# Patient Record
Sex: Female | Born: 2000 | Race: White | Hispanic: No | Marital: Single | Smoking: Never smoker
Health system: Southern US, Community
[De-identification: ages and names within clinical notes are randomized; demographics above are authoritative.]

## PROBLEM LIST (undated history)

## (undated) HISTORY — PX: EYE SURGERY: SHX253

---
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
## 2020-07-22 ENCOUNTER — Encounter: Payer: Self-pay | Admitting: Emergency Medicine

## 2020-07-22 ENCOUNTER — Ambulatory Visit (INDEPENDENT_AMBULATORY_CARE_PROVIDER_SITE_OTHER): Payer: BC Managed Care – PPO

## 2020-07-22 ENCOUNTER — Other Ambulatory Visit: Payer: Self-pay

## 2020-07-22 ENCOUNTER — Ambulatory Visit
Admission: EM | Admit: 2020-07-22 | Discharge: 2020-07-22 | Disposition: A | Discharge status: Home / Self Care | Payer: BC Managed Care – PPO | Attending: Internal Medicine | Admitting: Internal Medicine

## 2020-07-22 DIAGNOSIS — J029 Acute pharyngitis, unspecified: Secondary | ICD-10-CM

## 2020-07-22 DIAGNOSIS — R042 Hemoptysis: Secondary | ICD-10-CM

## 2020-07-22 LAB — GROUP A STREP BY PCR: Group A Strep by PCR: NOT DETECTED

## 2020-07-22 MED ORDER — ACYCLOVIR 400 MG PO TABS: 400.0000 mg | ORAL_TABLET | Freq: Three times a day (TID) | ORAL | 0 refills | Status: AC

## 2020-07-22 NOTE — ED Provider Notes (Signed)
[facility]    CSN: [id number] Arrival date & time: 07/22/20  1017      History   Chief Complaint Chief Complaint  Patient presents with  . Sore Throat    HPI Lindsay Vazquez is a 19 y.o. female. who presents with inially having nose congetion and later on developed R ST. This am coughhed thick mucous with blood.  She has not been able to take allergy med due to seeing her allergist next week, but since she could not breath through her nose last night she took it and was able to breath better. Denies nausea more than her usual. She had negative pregnancy test 2 weeks ago, and has not been sexually active since May this year. She has completed the covid injections. She has been doing saline rinses.     History reviewed. No pertinent past medical history.  There are no problems to display for this patient.   Past Surgical History:  Procedure Laterality Date  . EYE SURGERY      OB History   No obstetric history on file.      Home Medications    Prior to Admission medications   Medication Sig Start Date End Date Taking? Authorizing Provider  desogestrel-ethinyl estradiol (VIORELE) 0.15-0.02/0.01 MG (21/5) tablet Take 1 tablet by mouth daily. 02/14/20  Yes [provider]  Levonorgestrel (SKYLA IU) by Intrauterine route.   Yes [provider]  acyclovir (ZOVIRAX) 400 MG tablet Take 1 tablet (400 mg total) by mouth 3 (three) times daily for 7 days. 07/22/20 07/29/20  Rodriguez-Southworth, Nettie Elm, PA-C    Family History Family History  Problem Relation Age of Onset  . Healthy Mother     Social History Social History   Tobacco Use  . Smoking status: Never Smoker  . Smokeless tobacco: Never Used  Vaping Use  . Vaping Use: Never used  Substance Use Topics  . Alcohol use: Never  . Drug use: Never     Allergies   Patient has no known allergies.   Review of Systems Review of Systems  Constitutional: Negative for appetite change,  chills, diaphoresis and fever.  HENT: Positive for congestion, postnasal drip and sore throat. Negative for ear discharge, ear pain, mouth sores, rhinorrhea and trouble swallowing.   Eyes: Negative for discharge.  Respiratory: Positive for cough and shortness of breath. Negative for chest tightness and wheezing.   Cardiovascular: Negative for chest pain and leg swelling.  Gastrointestinal: Positive for nausea. Negative for vomiting.       Has chronic GI symptoms and are not worse than her usual.   Musculoskeletal: Negative for gait problem and myalgias.  Skin: Negative for rash.  Allergic/Immunologic: Positive for environmental allergies.  Neurological: Negative for headaches.  Hematological: Negative for adenopathy.     Physical Exam Triage Vital Signs ED Triage Vitals  Enc Vitals Group     BP 07/22/20 1035 106/81     Pulse Rate 07/22/20 1035 88     Resp 07/22/20 1035 14     Temp 07/22/20 1035 98.9 F (37.2 C)     Temp Source 07/22/20 1035 Oral     SpO2 07/22/20 1035 100 %     Weight 07/22/20 1033 135 lb (61.2 kg)     Height 07/22/20 1033 5\' 7"  (1.702 m)     Head Circumference --      Peak Flow --      Pain Score 07/22/20 1033 7     Pain Loc --  Pain Edu? --      Excl. in GC? --    No data found.  Updated Vital Signs BP 106/81 (BP Location: Left Arm)   Pulse 88   Temp 98.9 F (37.2 C) (Oral)   Resp 14   Ht 5\' 7"  (1.702 m)   Wt 135 lb (61.2 kg)   SpO2 100%   BMI 21.14 kg/m   Visual Acuity Right Eye Distance:   Left Eye Distance:   Bilateral Distance:    Right Eye Near:   Left Eye Near:    Bilateral Near:     Physical Exam Vitals and nursing note reviewed.  Constitutional:      General: She is not in acute distress.    Appearance: She is normal weight. She is not toxic-appearing.  HENT:     Head: Atraumatic.     Right Ear: Tympanic membrane and ear canal normal.     Left Ear: Tympanic membrane and ear canal normal.     Nose: No congestion.      Mouth/Throat:     Mouth: Mucous membranes are moist.     Pharynx: Posterior oropharyngeal erythema present. No oropharyngeal exudate.     Comments: Has mild erythema on R with a shallow ulcer Eyes:     Conjunctiva/sclera: Conjunctivae normal.  Neck:     Comments: On R upper chain Cardiovascular:     Rate and Rhythm: Normal rate and regular rhythm.     Heart sounds: No murmur heard.   Pulmonary:     Effort: Pulmonary effort is normal.     Breath sounds: Normal breath sounds.  Musculoskeletal:     Cervical back: Neck supple.  Lymphadenopathy:     Cervical: Cervical adenopathy present.  Skin:    General: Skin is warm and dry.     Findings: No rash.  Neurological:     Mental Status: She is alert and oriented to person, place, and time.  Psychiatric:        Mood and Affect: Mood normal.        Behavior: Behavior normal.      UC Treatments / Results  Labs (all labs ordered are listed, but only abnormal results are displayed) Labs Reviewed  GROUP A STREP BY PCR    EKG   Radiology DG Chest 2 View  Result Date: 07/22/2020 CLINICAL DATA:  19 year old female with history of hemoptysis. EXAM: CHEST - 2 VIEW COMPARISON:  No priors. FINDINGS: Lung volumes are normal. No consolidative airspace disease. No pleural effusions. No pneumothorax. No pulmonary nodule or mass noted. Pulmonary vasculature and the cardiomediastinal silhouette are within normal limits. IMPRESSION: No radiographic evidence of acute cardiopulmonary disease. Electronically Signed   By: 12 M.D.   On: 07/22/2020 11:23    Procedures Procedures (including critical care time)  Medications Ordered in UC Medications - No data to display  Initial Impression / Assessment and Plan / UC Course  I have reviewed the triage vital signs and the nursing notes. Strep test was neg.  Pertinent labs & imaging results that were available during my care of the patient were reviewed by me and considered in my  medical decision making (see chart for details). Her R throat seems to have an ulcer which I believe may be herpetic, so I placed her on Acyclovir as noted.   Final Clinical Impressions(s) / UC Diagnoses   Final diagnoses:  Viral pharyngitis  Hemoptysis     Discharge Instructions     You  dont have strep and your chest xray is normal.  I will have you try an antiviral medication for that blistery area you have on your right throat.  Use saline nose rinses as well to help the nose congestion  Watch this video who to do saline nose rinses.  [email or web address]   Do not use tap water, only boiled water that has been cooled down.  Do it twice a day  for 5-7 days, but avoid bed time.   Stay quarantined until the covid test comes back    ED Prescriptions    Medication Sig Dispense Auth. Provider   acyclovir (ZOVIRAX) 400 MG tablet Take 1 tablet (400 mg total) by mouth 3 (three) times daily for 7 days. 21 tablet Rodriguez-Southworth, Nettie Elm, PA-C     PDMP not reviewed this encounter.   Garey Ham, [place] 07/23/20 [phone]

## 2020-07-22 NOTE — Discharge Instructions (Addendum)
You dont have strep and your chest xray is normal.  I will have you try an antiviral medication for that blistery area you have on your right throat.  Use saline nose rinses as well to help the nose congestion  Watch this video who to do saline nose rinses.  [email or web address]   Do not use tap water, only boiled water that has been cooled down.  Do it twice a day  for 5-7 days, but avoid bed time.   Stay quarantined until the covid test comes back

## 2020-07-22 NOTE — ED Triage Notes (Signed)
Patient c/o sore throat, stuffy nose and headache that started yesterday.  Patient denies fevers.

## 2020-09-05 ENCOUNTER — Other Ambulatory Visit: Payer: Self-pay | Admitting: Dermatology

## 2020-09-05 ENCOUNTER — Ambulatory Visit: Payer: BC Managed Care – PPO | Admitting: Dermatology

## 2020-09-05 ENCOUNTER — Other Ambulatory Visit: Payer: Self-pay

## 2020-09-05 DIAGNOSIS — R21 Rash and other nonspecific skin eruption: Secondary | ICD-10-CM | POA: Diagnosis not present

## 2020-09-05 DIAGNOSIS — L259 Unspecified contact dermatitis, unspecified cause: Secondary | ICD-10-CM

## 2020-09-05 MED ORDER — PREDNISONE 5 MG PO TABS: ORAL_TABLET | ORAL | 0 refills | Status: DC

## 2020-09-05 MED ORDER — CEFDINIR 300 MG PO CAPS: 300.0000 mg | ORAL_CAPSULE | Freq: Two times a day (BID) | ORAL | 0 refills | Status: DC

## 2020-09-05 MED ORDER — CLOBETASOL PROP EMOLLIENT BASE 0.05 % EX CREA: TOPICAL_CREAM | CUTANEOUS | 0 refills | Status: DC

## 2020-09-05 NOTE — Progress Notes (Signed)
   New Patient Visit  Subjective  Lindsay Vazquez is a 19 y.o. female who presents for the following: Rash (legs, ~77m, itchy and painful past few days, calimine lotion, otc HC lotion, TMC 0.1% cr didnt help, took a round of Keflex, antihistamine). 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  They initially came up looking like chigger bites, and got a little better when treated with keflex, but rash then got worse and seems to be spreading.   The following portions of the chart were reviewed this encounter and updated as appropriate:      Review of Systems:  No other skin or systemic complaints except as noted in HPI or Assessment and Plan.  Objective  Well appearing patient in no apparent distress; mood and affect are within normal limits.  A focused examination was performed including bil legs. Relevant physical exam findings are noted in the Assessment and Plan.  Objective  B/L leg: Multiple vesicular erythematous plaques, some eroded on the B/L med thighs and L post thigh, Pustule on R lower leg   Assessment & Plan  Rash B/L leg  Contact dermatitis with possible secondary infection -   Start Prednisone 3 week taper (instruction handout given) and Clobetasol cream apply to aa's BID until rash cleared, avoid f/g/a. Start OTC Domeboro's packets - mix as directed then apply to aa's and cover with wet compress. Let sit for 15-20 minutes then pat dry and apply Clobetasol cream. Do this twice per day for 2-3 days. Start Omnicef 300mg  po  BID x 10 days. Bacterial culture today.  Risks of prednisone taper discussed including mood irritability, insomnia, weight gain, stomach ulcers, increased risk of infection, increased blood sugar (diabetes), hypertension, osteoporosis with long-term or frequent use, and rare risk of avascular necrosis of the hip.   Topical steroids (such as triamcinolone, fluocinolone, fluocinonide, mometasone,  clobetasol, halobetasol, betamethasone, hydrocortisone) can cause thinning and lightening of the skin if they are used for too long in the same area. Your physician has selected the right strength medicine for your problem and area affected on the body. Please use your medication only as directed by your physician to prevent side effects.     predniSONE (DELTASONE) 5 MG tablet - B/L leg  Clobetasol Prop Emollient Base (CLOBETASOL PROPIONATE E) 0.05 % emollient cream - B/L leg  cefdinir (OMNICEF) 300 MG capsule - B/L leg  Aerobic Culture - B/L leg  Return in about 4 weeks (around 10/03/2020), or if symptoms worsen or fail to improve.  12/03/2020, CMA, am acting as scribe for Maylene Roes, MD .  Documentation: I have reviewed the above documentation for accuracy and completeness, and I agree with the above.  Willeen Niece MD

## 2020-09-05 NOTE — Patient Instructions (Addendum)
Start OTC Domeboro's packets - mix as directed then apply to aa's and cover with wet compress/rags. Let sit for 15-20 minutes then pat dry and apply Clobetasol cream. Do this twice per day for 2-3 days.   3 Week Prednisone Taper  You will be given a prescription for 150 tablets of oral Prednisone. It is very important that you take this according to the exact schedule provided below. This type of regimen for taking medication is often called a "taper", because your dosage will steadily decrease over a two week period until it is discontinued altogether.  ALWAYS take this medicine with food to prevent it from irritating your stomach. You should also take your Prednisone during morning hours.  Call the clinic at [phone] if you gain more than two pounds in one day, notice swelling anywhere on your body, have shortness of breath, black or red bowel movements, brown or red vomitus, desire to drink large amounts of fluids, a fever, or extreme weakness.   Oral Prednisone over Three Weeks  Day  Week 1  Week 2  Week 3   1  12  tablets  9 tablets  5 tablets   2  12 tablets  8 tablets  5 tablets   3  11 tablets  8 tablets  4 tablets   4  11 tablets  7 tablets  4 tablets   5  10 tablets  7 tablets  3 tablets   6  10 tablets  6 tablets  2 tablets   7  9 tablets  6 tablets  1 tablet    Risks of prednisone taper discussed including mood irritability, insomnia, weight gain, stomach ulcers, increased risk of infection, increased blood sugar (diabetes), hypertension, osteoporosis with long-term or frequent use, and rare risk of avascular necrosis of the hip.   Topical steroids (such as triamcinolone, fluocinolone, fluocinonide, mometasone, clobetasol, halobetasol, betamethasone, hydrocortisone) can cause thinning and lightening of the skin if they are used for too long in the same area. Your physician has selected the right strength medicine for your problem and area affected on the body. Please use your  medication only as directed by your physician to prevent side effects.

## 2020-09-09 LAB — AEROBIC CULTURE

## 2020-09-12 ENCOUNTER — Telehealth: Payer: Self-pay

## 2020-09-12 NOTE — Telephone Encounter (Signed)
Left message for patient to call for culture results.

## 2020-09-13 ENCOUNTER — Telehealth: Payer: Self-pay

## 2020-09-13 NOTE — Telephone Encounter (Signed)
-----   Message from Willeen Niece, MD sent at 09/11/2020  7:25 PM EDT ----- Positive for Staph aureus. Sensitive to Cefdinir- pt currently taking Please ask and see if she is improving

## 2020-09-13 NOTE — Telephone Encounter (Signed)
Left patient message to call for culture results./sh

## 2020-09-19 ENCOUNTER — Telehealth: Payer: Self-pay

## 2020-09-19 NOTE — Telephone Encounter (Signed)
Left message for patient to call for culture results.

## 2020-09-26 ENCOUNTER — Ambulatory Visit: Payer: BC Managed Care – PPO | Admitting: Dermatology

## 2020-09-28 ENCOUNTER — Other Ambulatory Visit: Payer: Self-pay | Admitting: Dermatology

## 2020-09-28 DIAGNOSIS — R21 Rash and other nonspecific skin eruption: Secondary | ICD-10-CM

## 2020-10-02 ENCOUNTER — Other Ambulatory Visit: Payer: Self-pay | Admitting: Dermatology

## 2020-10-02 ENCOUNTER — Telehealth: Payer: Self-pay

## 2020-10-02 DIAGNOSIS — R21 Rash and other nonspecific skin eruption: Secondary | ICD-10-CM

## 2020-10-02 NOTE — Telephone Encounter (Signed)
Pt left vm requesting refill for staph infection. Left vm for pt advising her that she needs another appt before any refills and to call back to schedule.

## 2020-10-03 ENCOUNTER — Other Ambulatory Visit: Payer: Self-pay

## 2020-10-03 ENCOUNTER — Ambulatory Visit (INDEPENDENT_AMBULATORY_CARE_PROVIDER_SITE_OTHER): Payer: BC Managed Care – PPO | Admitting: Dermatology

## 2020-10-03 DIAGNOSIS — R21 Rash and other nonspecific skin eruption: Secondary | ICD-10-CM | POA: Diagnosis not present

## 2020-10-03 MED ORDER — CEFDINIR 300 MG PO CAPS: 300.0000 mg | ORAL_CAPSULE | Freq: Two times a day (BID) | ORAL | 0 refills | Status: AC

## 2020-10-03 NOTE — Patient Instructions (Addendum)
Wound Care Instructions  1. Cleanse wound gently with soap and water once a day then pat dry with clean gauze. Apply a thing coat of Petrolatum (petroleum jelly, "Vaseline") over the wound (unless you have an allergy to this). We recommend that you use a new, sterile tube of Vaseline. Do not pick or remove scabs. Do not remove the yellow or white "healing tissue" from the base of the wound.  2. Cover the wound with fresh, clean, nonstick gauze and secure with paper tape. You may use Band-Aids in place of gauze and tape if the would is small enough, but would recommend trimming much of the tape off as there is often too much. Sometimes Band-Aids can irritate the skin.  3. You should call the office for your biopsy report after 1 week if you have not already been contacted.  4. If you experience any problems, such as abnormal amounts of bleeding, swelling, significant bruising, significant pain, or evidence of infection, please call the office immediately.   Topical steroids (such as triamcinolone, fluocinolone, fluocinonide, mometasone, clobetasol, halobetasol, betamethasone, hydrocortisone) can cause thinning and lightening of the skin if they are used for too long in the same area. Your physician has selected the right strength medicine for your problem and area affected on the body. Please use your medication only as directed by your physician to prevent side effects.   

## 2020-10-03 NOTE — Progress Notes (Signed)
   Follow-Up Visit   Subjective  Lindsay Vazquez is a 19 y.o. female who presents for the following: Follow-up (Rash on legs, culture positive for staph. Patient completed Prednisone 3-week taper, Cefdinir BID x 10 days, Clobetasol Cream. ).  Patient has a few spots that didn't resolve from the treatments, and a few new spots. Spots are itchy, but improved with Clobetasol cream. No drainage coming from lesions.  Patient's roommate has a dog and patient is allergic to dogs. She takes a Zyrtec every day.  The following portions of the chart were reviewed this encounter and updated as appropriate:      Review of Systems:  No other skin or systemic complaints except as noted in HPI or Assessment and Plan.  Objective  Well appearing patient in no apparent distress; mood and affect are within normal limits.  A focused examination was performed including legs. Relevant physical exam findings are noted in the Assessment and Plan.  Objective  Left Thigh - Posterior, Right Anterior Thigh, Left Posterior Thigh: Pink slightly scaly patches with central clearing on right anterior thigh and left posterior thigh. Scattered pink macules with central pink papule.  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  Appears more like allergic reaction to bites today.  Vrs staph folliculitis, doubt tinea, but would consider if not improving with clobetasol cream and oral antibiotics (tinea incognito)  3.5 mm Punch Biopsy performed today to left posterior thigh.  Restart Omnicef 300mg  take 1 po BID x 10 days dsp #20 0Rf Continue Clobetasol Cream BID AAs.   Clobetasol Prop Emollient Base (CLOBETASOL PROPIONATE E) 0.05 % emollient cream - Left Thigh - Posterior, Right Anterior Thigh, Left Posterior Thigh  Skin / nail biopsy - Left Thigh - Posterior Type of biopsy: punch     Informed consent: discussed and consent obtained   Patient was prepped and draped in usual sterile fashion: Area prepped with alcohol. Anesthesia: the lesion was anesthetized in a standard fashion   Anesthetic:  1% lidocaine w/ epinephrine 1-100,000 buffered w/ 8.4% NaHCO3 Punch size:  3.5 mm Suture size:  4-0 Suture type: nylon   Suture removal (days):  7 Hemostasis achieved with: suture and pressure   Outcome: patient tolerated procedure well   Post-procedure details: wound care instructions given   Post-procedure details comment:  Ointment and pressure dressing applied  cefdinir (OMNICEF) 300 MG capsule - Left Thigh - Posterior, Right Anterior Thigh, Left Posterior Thigh  Specimen 2 - Surgical pathology Differential Diagnosis: Bite Reaction vs Contact Dermatitis vs Papular Urticaria vs Staph Folliculitis Check Margins: No Pink slightly scaly patches with central clearing on right anterior thigh and left posterior thigh.  Return in about 1 week (around 10/10/2020) for SR with Dr 12/10/2020.   Documentation: I have reviewed the above documentation for accuracy and completeness, and I agree with the above.  Kirtland Bouchard MD

## 2020-10-10 ENCOUNTER — Other Ambulatory Visit: Payer: Self-pay

## 2020-10-10 ENCOUNTER — Ambulatory Visit (INDEPENDENT_AMBULATORY_CARE_PROVIDER_SITE_OTHER): Payer: BC Managed Care – PPO | Admitting: Dermatology

## 2020-10-10 DIAGNOSIS — L738 Other specified follicular disorders: Secondary | ICD-10-CM

## 2020-10-10 DIAGNOSIS — L739 Follicular disorder, unspecified: Secondary | ICD-10-CM

## 2020-10-10 MED ORDER — DOXYCYCLINE MONOHYDRATE 100 MG PO CAPS: ORAL_CAPSULE | ORAL | 0 refills | Status: AC

## 2020-10-10 MED ORDER — CLINDAMYCIN PHOSPHATE 1 % EX LOTN: TOPICAL_LOTION | Freq: Two times a day (BID) | CUTANEOUS | 0 refills | Status: AC

## 2020-10-10 NOTE — Patient Instructions (Signed)
Start over the counter Panoxyl wash to skin daily   Doxycycline should be taken with food to prevent nausea. Do not lay down for 30 minutes after taking. Be cautious with sun exposure and use good sun protection while on this medication. Pregnant women should not take this medication.

## 2020-10-10 NOTE — Progress Notes (Signed)
   Follow-Up Visit   Subjective  Lindsay Vazquez is a 19 y.o. female who presents for the following: Follow-up (Biopsy proven SUPPURATIVE FOLLICULITIS at L thigh posterior, pt here for suture removal and discuss treatment plan ).  The following portions of the chart were reviewed this encounter and updated as appropriate:  Tobacco  Allergies  Meds  Problems  Med Hx  Surg Hx  Fam Hx     Review of Systems:  No other skin or systemic complaints except as noted in HPI or Assessment and Plan.  Objective  Well appearing patient in no apparent distress; mood and affect are within normal limits.  A focused examination was performed including L posterior thigh . Relevant physical exam findings are noted in the Assessment and Plan.  Objective  Left post thigh: folliculitis    Assessment & Plan  Folliculitis, biopsy proven with previous history of Staph culture Left post thigh Biopsy proven SUPPURATIVE FOLLICULITIS Pt has improved significantly since seen last week.   We will follow Dr Marca Ancona recommendations: Inflammation of follicle.  She has had culture proven staph.  She is currently taking Omnicef.  Stop clobetasol and start Clindamycin lotion daily after shower. After she finishes the [place], I would start her on Doxycycline mono.100 mg PO bid with food #60 no rfs.  Have her take bid for 2 weeks, then qd until gone (4 wks).  She should also use Panoxyl 4% creamy wash in shower to aas.  Let sit several min prior to rinsing. Risk bleaching.   Encounter for Removal of Sutures - Incision site at the L post thigh is clean, dry and intact - Wound cleansed, sutures removed, wound cleansed and steri strips applied.  - Discussed pathology results showing folliculitis  - Scars remodel for a full year. - Once steri-strips fall off, patient can apply over-the-counter silicone scar cream each night to help with scar remodeling if desired. - Patient advised to call with any concerns or if  they notice any new or changing lesions.   Ordered Medications: doxycycline (MONODOX) 100 MG capsule clindamycin (CLEOCIN-T) 1 % lotion  Return in about 1 month to see Dr Roseanne Reno (around 10/17/2020) for folliculitis.  IAngelique Holm, CMA, am acting as scribe for Armida Sans, MD .  Documentation: I have reviewed the above documentation for accuracy and completeness, and I agree with the above.  Armida Sans, MD

## 2020-10-11 ENCOUNTER — Encounter: Payer: Self-pay | Admitting: Dermatology

## 2020-11-14 ENCOUNTER — Ambulatory Visit: Payer: BC Managed Care – PPO | Admitting: Dermatology

## 2020-11-15 ENCOUNTER — Ambulatory Visit: Payer: BC Managed Care – PPO | Admitting: Dermatology

## 2020-12-19 IMAGING — CR DG CHEST 2V
2 series · 2 of 2 positions shown · non-contrast
Comparison: No priors.

CLINICAL DATA: 19-year-old female with history of hemoptysis.

EXAM:
CHEST - 2 VIEW

[chest pa]
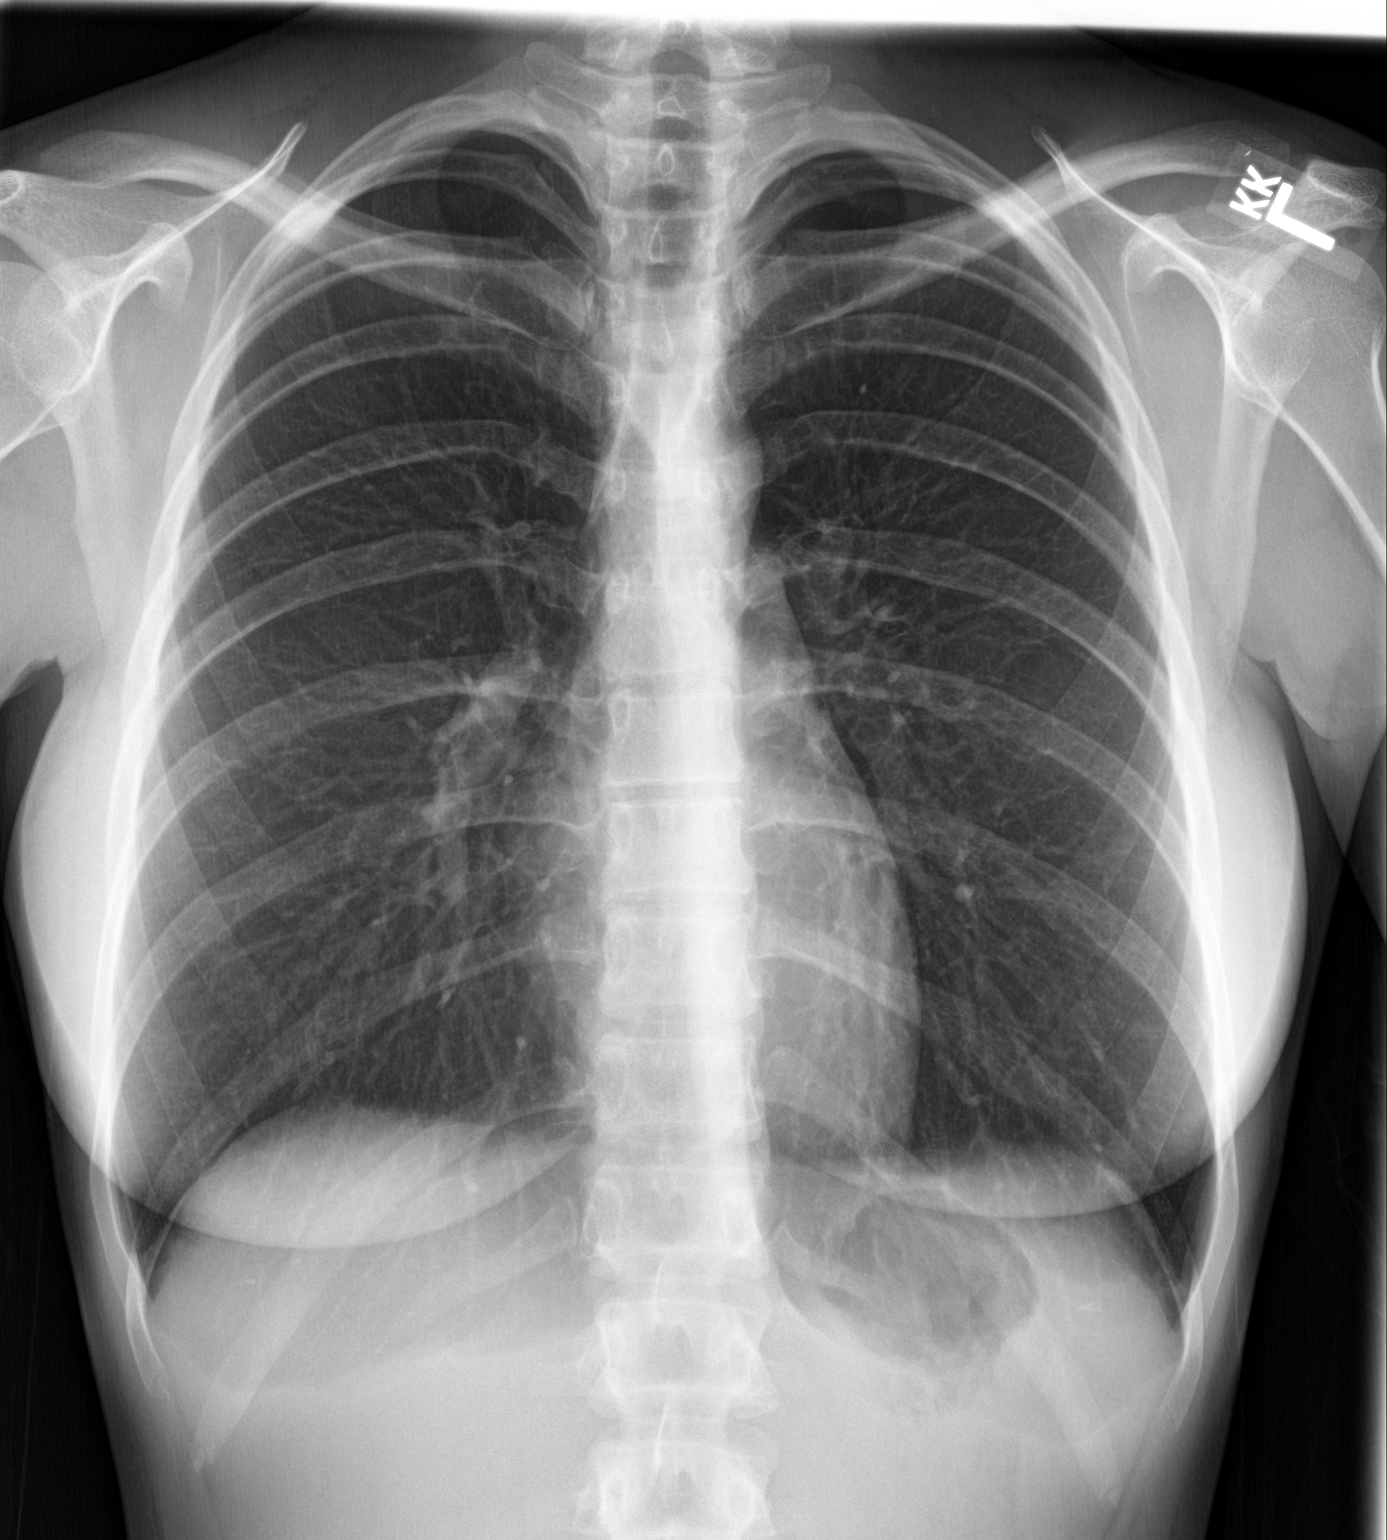

[chest lat]
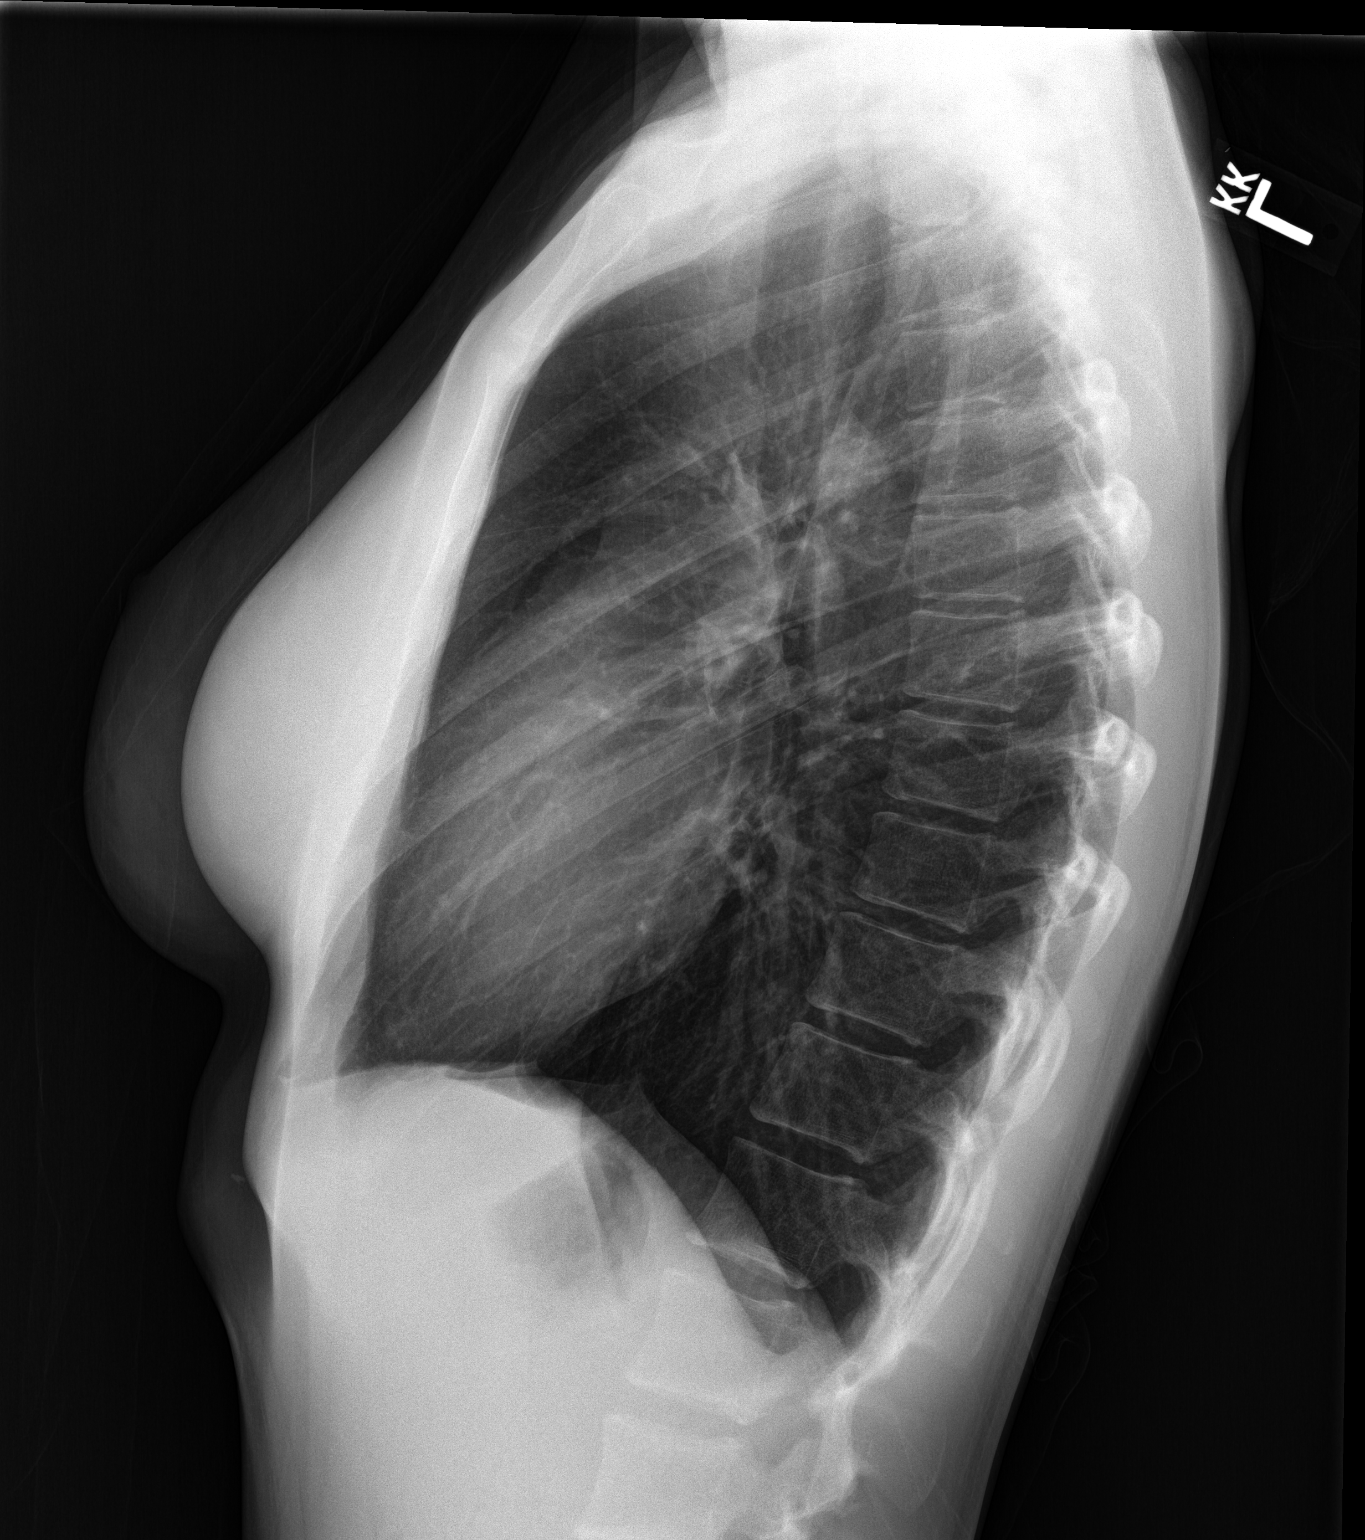

[2 of 2 positions shown; findings below may reference images not displayed]

FINDINGS: Lung volumes are normal. No consolidative airspace disease. No
pleural effusions. No pneumothorax. No pulmonary nodule or mass
noted. Pulmonary vasculature and the cardiomediastinal silhouette
are within normal limits.
IMPRESSION: No radiographic evidence of acute cardiopulmonary disease.

## 2020-12-27 ENCOUNTER — Other Ambulatory Visit: Payer: Self-pay | Admitting: Dermatology

## 2020-12-27 DIAGNOSIS — R21 Rash and other nonspecific skin eruption: Secondary | ICD-10-CM
